# Patient Record
Sex: Male | Born: 1965 | Race: Black or African American | Hispanic: No | Marital: Single | Smoking: Current every day smoker
Health system: Southern US, Community
[De-identification: ages and names within clinical notes are randomized; demographics above are authoritative.]

## PROBLEM LIST (undated history)

## (undated) DIAGNOSIS — I10 Essential (primary) hypertension: Secondary | ICD-10-CM

---

## 2020-04-28 ENCOUNTER — Emergency Department (HOSPITAL_BASED_OUTPATIENT_CLINIC_OR_DEPARTMENT_OTHER)
Admission: EM | Admit: 2020-04-28 | Discharge: 2020-04-28 | Disposition: A | Discharge status: Home / Self Care | Payer: PRIVATE HEALTH INSURANCE | Attending: Emergency Medicine | Admitting: Emergency Medicine

## 2020-04-28 ENCOUNTER — Other Ambulatory Visit: Payer: Self-pay

## 2020-04-28 ENCOUNTER — Encounter (HOSPITAL_BASED_OUTPATIENT_CLINIC_OR_DEPARTMENT_OTHER): Payer: Self-pay | Admitting: *Deleted

## 2020-04-28 DIAGNOSIS — Z5321 Procedure and treatment not carried out due to patient leaving prior to being seen by health care provider: Secondary | ICD-10-CM | POA: Diagnosis not present

## 2020-04-28 DIAGNOSIS — R519 Headache, unspecified: Secondary | ICD-10-CM | POA: Diagnosis not present

## 2020-04-28 DIAGNOSIS — Z20822 Contact with and (suspected) exposure to covid-19: Secondary | ICD-10-CM | POA: Diagnosis not present

## 2020-04-28 HISTORY — DX: Essential (primary) hypertension: I10

## 2020-04-28 NOTE — ED Triage Notes (Addendum)
C/o h/a x 1 week  covid exposure x 1 week ago

## 2020-05-20 ENCOUNTER — Encounter (HOSPITAL_BASED_OUTPATIENT_CLINIC_OR_DEPARTMENT_OTHER): Payer: Self-pay | Admitting: *Deleted

## 2020-05-20 ENCOUNTER — Emergency Department (HOSPITAL_BASED_OUTPATIENT_CLINIC_OR_DEPARTMENT_OTHER): Payer: PRIVATE HEALTH INSURANCE

## 2020-05-20 ENCOUNTER — Emergency Department (HOSPITAL_BASED_OUTPATIENT_CLINIC_OR_DEPARTMENT_OTHER)
Admission: EM | Admit: 2020-05-20 | Discharge: 2020-05-21 | Discharge status: Against Medical Advice | Payer: PRIVATE HEALTH INSURANCE | Attending: Emergency Medicine | Admitting: Emergency Medicine

## 2020-05-20 ENCOUNTER — Other Ambulatory Visit: Payer: Self-pay

## 2020-05-20 DIAGNOSIS — I10 Essential (primary) hypertension: Secondary | ICD-10-CM | POA: Insufficient documentation

## 2020-05-20 DIAGNOSIS — R079 Chest pain, unspecified: Secondary | ICD-10-CM

## 2020-05-20 DIAGNOSIS — F17210 Nicotine dependence, cigarettes, uncomplicated: Secondary | ICD-10-CM | POA: Diagnosis not present

## 2020-05-20 DIAGNOSIS — R519 Headache, unspecified: Secondary | ICD-10-CM

## 2020-05-20 NOTE — ED Notes (Signed)
Pt refusing labs, stating he had labs done a couple days ago, this RN stated we could not use labs completed 6 days ago for treatment here. Pt verbalized understanding and stated he did not want to get stuck by anymore needles.

## 2020-05-20 NOTE — ED Triage Notes (Signed)
C/o left sided chest pain which radiates into left arm and jaw x 2 days

## 2020-05-20 NOTE — ED Notes (Signed)
Patient states he thinks he is fine and just wants something for his headache. States he will call his doctor tomorrow and set up an appointment to discuss his BP medications.

## 2020-05-21 MED ORDER — ACETAMINOPHEN 500 MG PO TABS
1000.0000 mg | ORAL_TABLET | Freq: Once | ORAL | Status: AC
  Administered 2020-05-21: 1000 mg via ORAL
  Filled 2020-05-21: qty 2

## 2020-05-21 NOTE — ED Provider Notes (Signed)
[facility] EMERGENCY DEPARTMENT Provider Note   CSN: [id number] Arrival date & time: 05/20/20  1911     History Chief Complaint  Patient presents with  . Chest Pain    Donald Hayes is a 55 y.o. male.  Patient presented to the emergency department with multiple complaints.  Patient experiencing intermittent headaches that have been fairly frequent recently.  He thinks it is because his blood pressure has been elevated.  No vision change.  Patient did have chest pain earlier today.  This radiated to the left side of the neck and jaw.  At time of my evaluation, patient reports that pain has resolved.  No shortness of breath.        Past Medical History:  Diagnosis Date  . Hypertension     There are no problems to display for this patient.   History reviewed. No pertinent surgical history.     No family history on file.  Social History   Tobacco Use  . Smoking status: Current Every Day Smoker    Packs/day: 1.00    Types: Cigarettes  . Smokeless tobacco: Never Used  Substance Use Topics  . Alcohol use: Yes  . Drug use: Not Currently    Home Medications Prior to Admission medications   Not on File    Allergies    Onion and Other  Review of Systems   Review of Systems  Cardiovascular: Positive for chest pain.  Neurological: Positive for headaches.  All other systems reviewed and are negative.   Physical Exam Updated Vital Signs BP 128/86   Pulse 63   Temp 98 F (36.7 C) (Oral)   Resp 18   Ht 5\' 11"  (1.803 m)   Wt 84.8 kg   SpO2 98%   BMI 26.08 kg/m   Physical Exam Vitals and nursing note reviewed.  Constitutional:      General: He is not in acute distress.    Appearance: Normal appearance. He is well-developed and well-nourished.  HENT:     Head: Normocephalic and atraumatic.     Right Ear: Hearing normal.     Left Ear: Hearing normal.     Nose: Nose normal.     Mouth/Throat:     Mouth: Oropharynx is clear and moist and  mucous membranes are normal.  Eyes:     Extraocular Movements: EOM normal.     Conjunctiva/sclera: Conjunctivae normal.     Pupils: Pupils are equal, round, and reactive to light.  Cardiovascular:     Rate and Rhythm: Regular rhythm.     Heart sounds: S1 normal and S2 normal. No murmur heard. No friction rub. No gallop.   Pulmonary:     Effort: Pulmonary effort is normal. No respiratory distress.     Breath sounds: Normal breath sounds.  Chest:     Chest wall: No tenderness.  Abdominal:     General: Bowel sounds are normal.     Palpations: Abdomen is soft. There is no hepatosplenomegaly.     Tenderness: There is no abdominal tenderness. There is no guarding or rebound. Negative signs include Murphy's sign and McBurney's sign.     Hernia: No hernia is present.  Musculoskeletal:        General: Normal range of motion.     Cervical back: Normal range of motion and neck supple.  Skin:    General: Skin is warm, dry and intact.     Findings: No rash.     Nails: There is no cyanosis.  Neurological:     Mental Status: He is alert and oriented to person, place, and time.     GCS: GCS eye subscore is 4. GCS verbal subscore is 5. GCS motor subscore is 6.     Cranial Nerves: No cranial nerve deficit.     Sensory: No sensory deficit.     Coordination: Coordination normal.     Deep Tendon Reflexes: Strength normal.  Psychiatric:        Mood and Affect: Mood and affect normal.        Speech: Speech normal.        Behavior: Behavior normal.        Thought Content: Thought content normal.     ED Results / Procedures / Treatments   Labs (all labs ordered are listed, but only abnormal results are displayed) Labs Reviewed  CBC WITH DIFFERENTIAL/PLATELET  COMPREHENSIVE METABOLIC PANEL  TROPONIN I (HIGH SENSITIVITY)    EKG EKG Interpretation  Date/Time:  Thursday May 20 2020 19:13:05 EST Ventricular Rate:  79 PR Interval:  146 QRS Duration: 88 QT Interval:  360 QTC  Calculation: 412 R Axis:   -19 Text Interpretation: Normal sinus rhythm Right atrial enlargement Borderline ECG Confirmed by Gilda Crease [id number]) on 05/20/2020 10:58:57 PM   Radiology DG Chest 2 View  Result Date: 05/20/2020 CLINICAL DATA:  Chest pain EXAM: CHEST - 2 VIEW COMPARISON:  None. FINDINGS: The heart size and mediastinal contours are within normal limits. Both lungs are clear. The visualized skeletal structures are unremarkable. IMPRESSION: No active cardiopulmonary disease. Electronically Signed   By: Charlett Nose M.D.   On: 05/20/2020 19:48    Procedures Procedures   Medications Ordered in ED Medications  acetaminophen (TYLENOL) tablet 1,000 mg (has no administration in time range)    ED Course  I have reviewed the triage vital signs and the nursing notes.  Pertinent labs & imaging results that were available during my care of the patient were reviewed by me and considered in my medical decision making (see chart for details).    MDM Rules/Calculators/A&P                          Patient with multiple complaints.  Patient reports frequent headaches that have been onset recently.  No neurologic findings on exam.  Discussed with patient that if he is having new onset unusual headaches he needs work-up including imaging such as CAT scan.  Patient does not wish to undergo CAT scan at this time.  Discussed with him the possibility of missing intracranial bleed, stroke, tumor, etc.  He reports that he will follow up with his primary doctor.  Patient also complaining of chest pain today.  Discussed with the patient the ominous sounding pain with left-sided chest pain radiating to the jaw, potentially representing cardiac etiology.  EKG does not show any obvious abnormality.  Recommended cycling troponins and further work-up.  Patient does not wish to undergo this testing today.  Discussed with him the possibility of missing a heart attack, permanent disability or even  death secondary to not perform work-up today.  Patient understands my recommendations but wishes to be discharged and call his doctor in the morning.  Patient counseled that he can return to the emergency department at any given time if he wishes to be reevaluated.  Based on my recommendations for work-ups for 2 separate but potentially life-threatening problems, will discharge patient AGAINST MEDICAL ADVICE with recommendations to return  immediately for any worsening symptoms.  Final Clinical Impression(s) / ED Diagnoses Final diagnoses:  Primary hypertension  Chest pain, unspecified type  Nonintractable episodic headache, unspecified headache type    Rx / DC Orders ED Discharge Orders    None       Iyona Pehrson, Canary Brim, MD 05/21/20 [phone]

## 2020-05-21 NOTE — Discharge Instructions (Addendum)
Please call your doctor in the morning to be seen as soon as possible.  If any of your symptoms worsen, return to the ED.

## 2022-02-13 IMAGING — CR DG CHEST 2V
2 series · 2 of 2 positions shown · non-contrast
Comparison: None.

CLINICAL DATA: Chest pain

EXAM:
CHEST - 2 VIEW

[w chest pa]
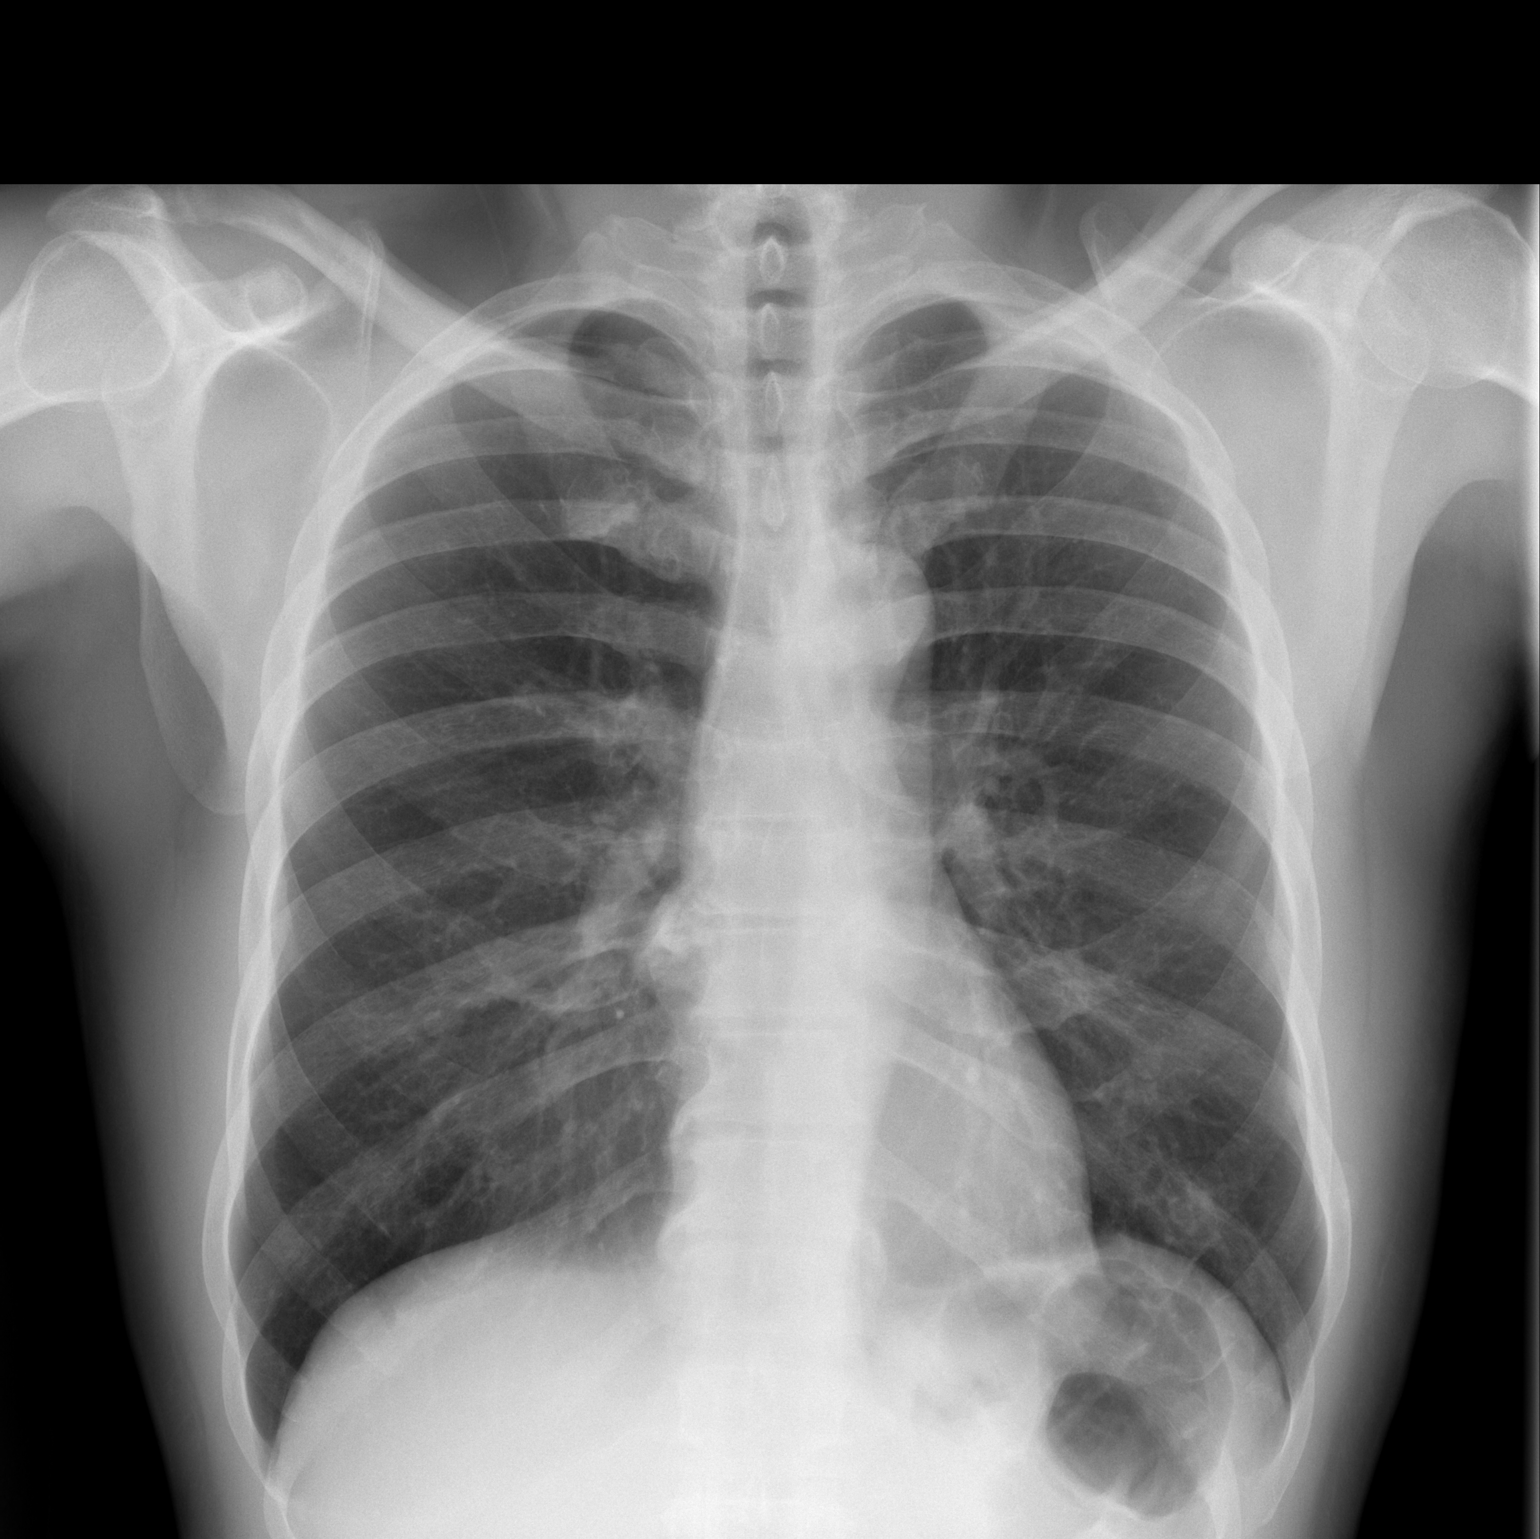

[w chest lat]
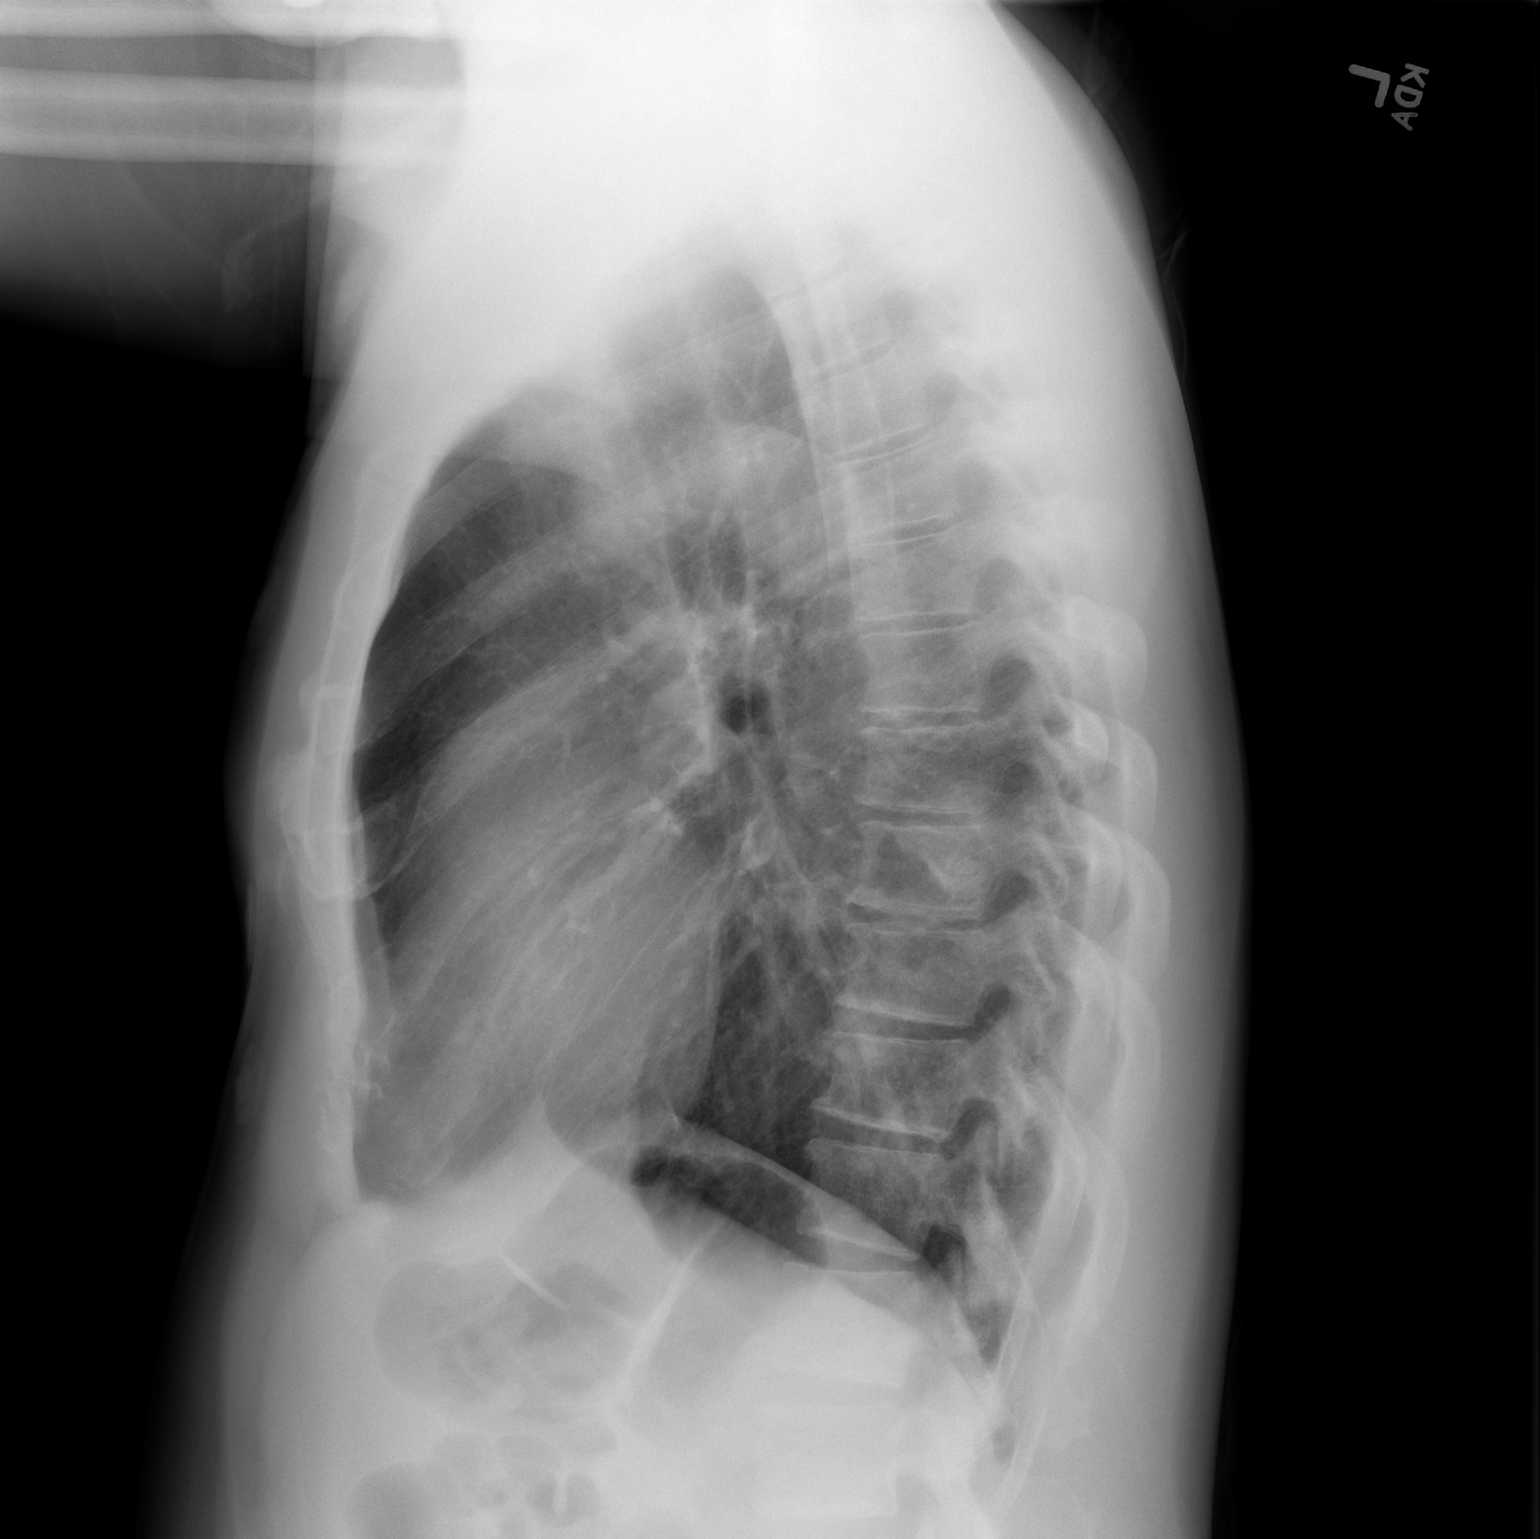

[2 of 2 positions shown; findings below may reference images not displayed]

FINDINGS: The heart size and mediastinal contours are within normal limits.
Both lungs are clear. The visualized skeletal structures are
unremarkable.
IMPRESSION: No active cardiopulmonary disease.
